# Patient Record
Sex: Female | Born: 1980 | Hispanic: No | Marital: Married | Smoking: Never smoker
Health system: Southern US, Community
[De-identification: ages and names within clinical notes are randomized; demographics above are authoritative.]

---

## 2006-01-20 ENCOUNTER — Other Ambulatory Visit: Admission: RE | Admit: 2006-01-20 | Discharge: 2006-01-20 | Payer: Self-pay | Admitting: Family Medicine

## 2007-02-15 ENCOUNTER — Other Ambulatory Visit: Admission: RE | Admit: 2007-02-15 | Discharge: 2007-02-15 | Payer: Self-pay | Admitting: Family Medicine

## 2008-04-30 ENCOUNTER — Inpatient Hospital Stay (HOSPITAL_COMMUNITY): Admission: AD | Admit: 2008-04-30 | Discharge: 2008-04-30 | Payer: Self-pay | Admitting: Obstetrics and Gynecology

## 2008-05-05 ENCOUNTER — Inpatient Hospital Stay (HOSPITAL_COMMUNITY): Admission: AD | Admit: 2008-05-05 | Discharge: 2008-05-05 | Payer: Self-pay | Admitting: Obstetrics and Gynecology

## 2008-05-13 ENCOUNTER — Inpatient Hospital Stay (HOSPITAL_COMMUNITY): Admission: AD | Admit: 2008-05-13 | Discharge: 2008-05-15 | Payer: Self-pay | Admitting: Obstetrics & Gynecology

## 2008-05-13 ENCOUNTER — Encounter (INDEPENDENT_AMBULATORY_CARE_PROVIDER_SITE_OTHER): Payer: Self-pay | Admitting: Obstetrics & Gynecology

## 2009-08-07 ENCOUNTER — Other Ambulatory Visit: Admission: RE | Admit: 2009-08-07 | Discharge: 2009-08-07 | Payer: Self-pay | Admitting: Family Medicine

## 2009-12-11 IMAGING — US US FETAL BPP W/O NONSTRESS
1 series · 6 of 6 positions shown · non-contrast
Comparison: none

OBSTETRICAL ULTRASOUND:
 This ultrasound exam was performed in the [HOSPITAL] Ultrasound Department.  The OB US report was generated in the AS system, and faxed to the ordering physician.  This report is also available in [REDACTED] PACS.

[Series 1: us fetal bpp w/o nonstress · 0.28mm/px · 6 of 6 slices shown]
[im 1/6]
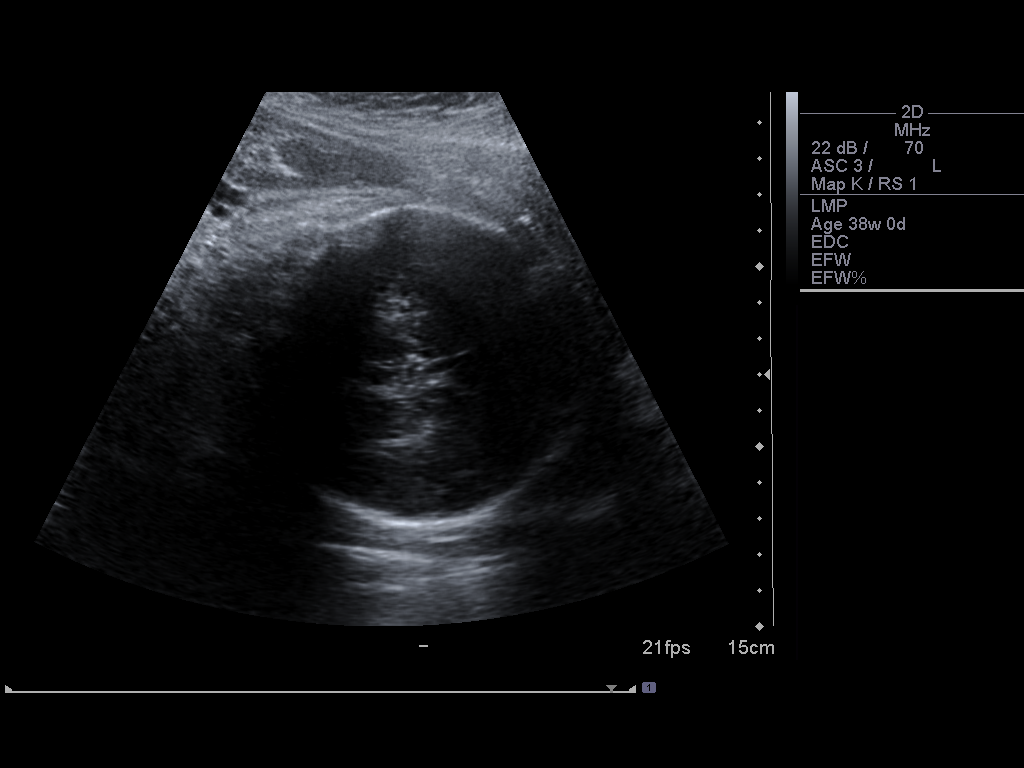
[im 2/6]
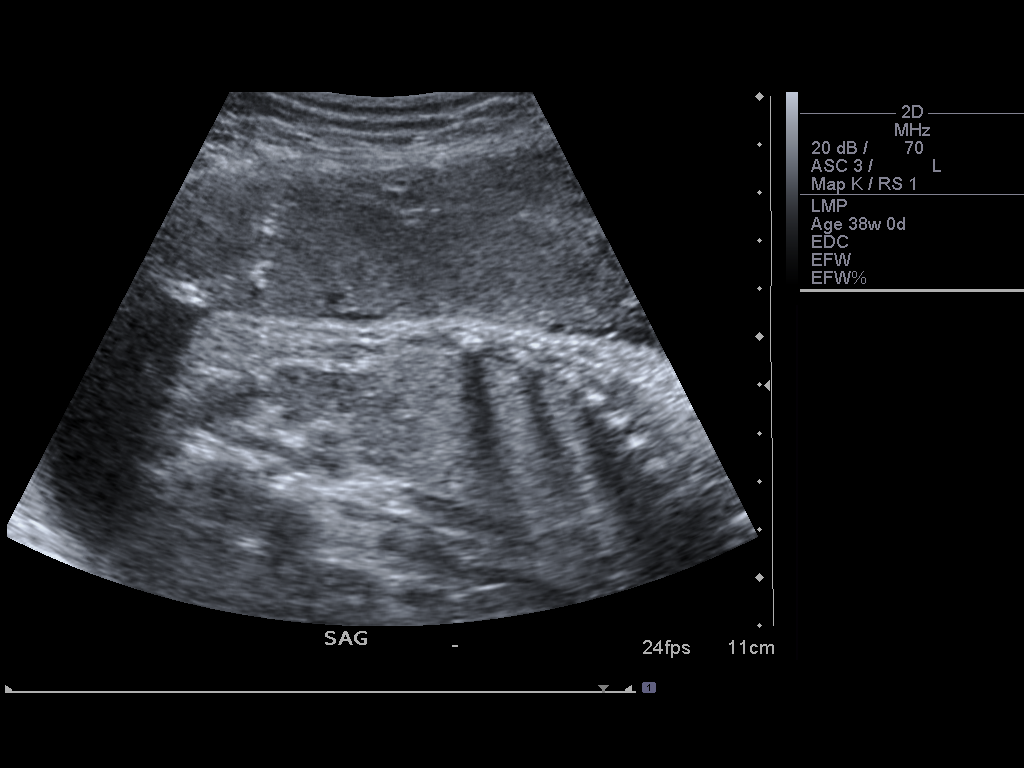
[im 3/6]
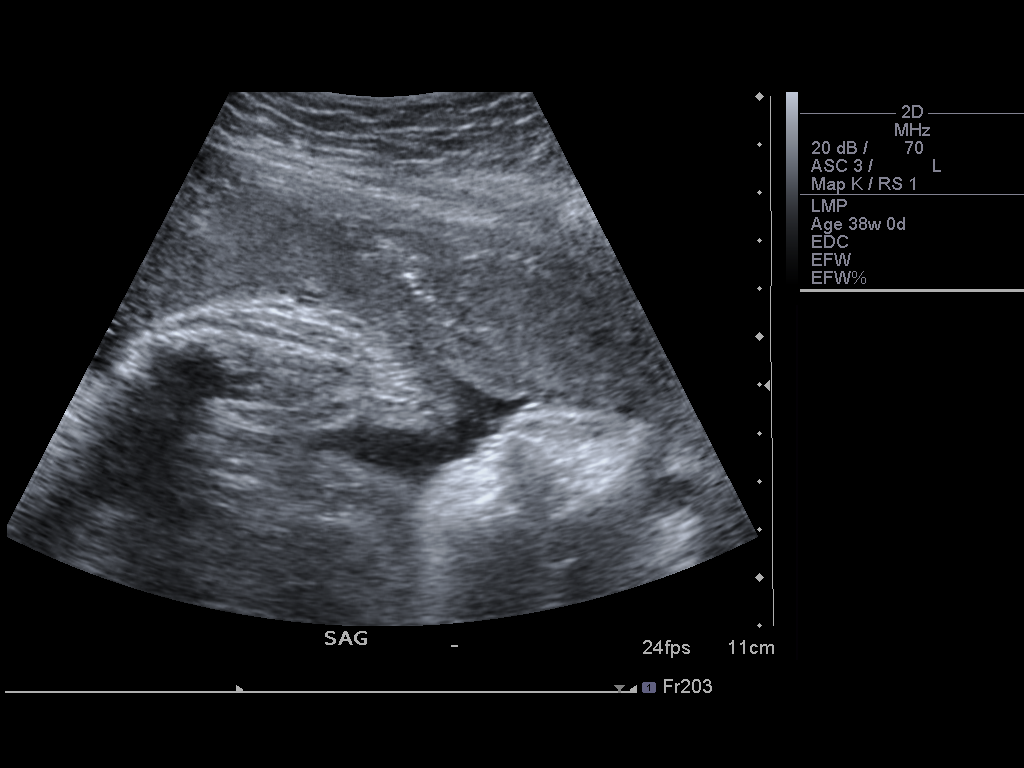
[im 4/6]
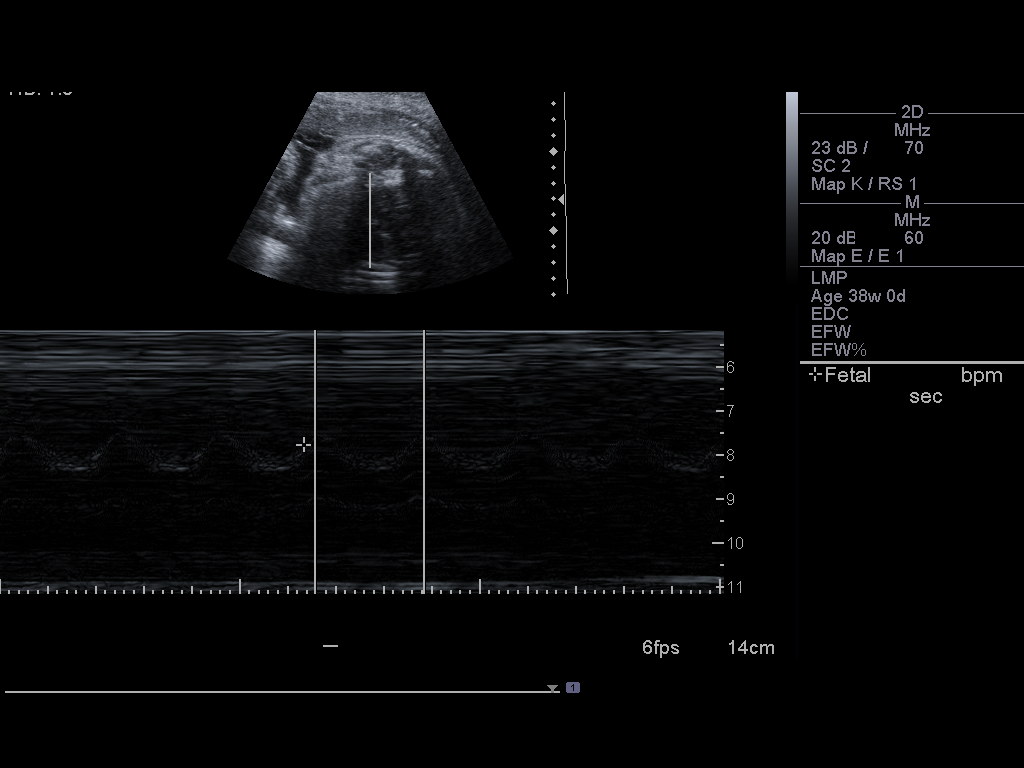
[im 5/6]
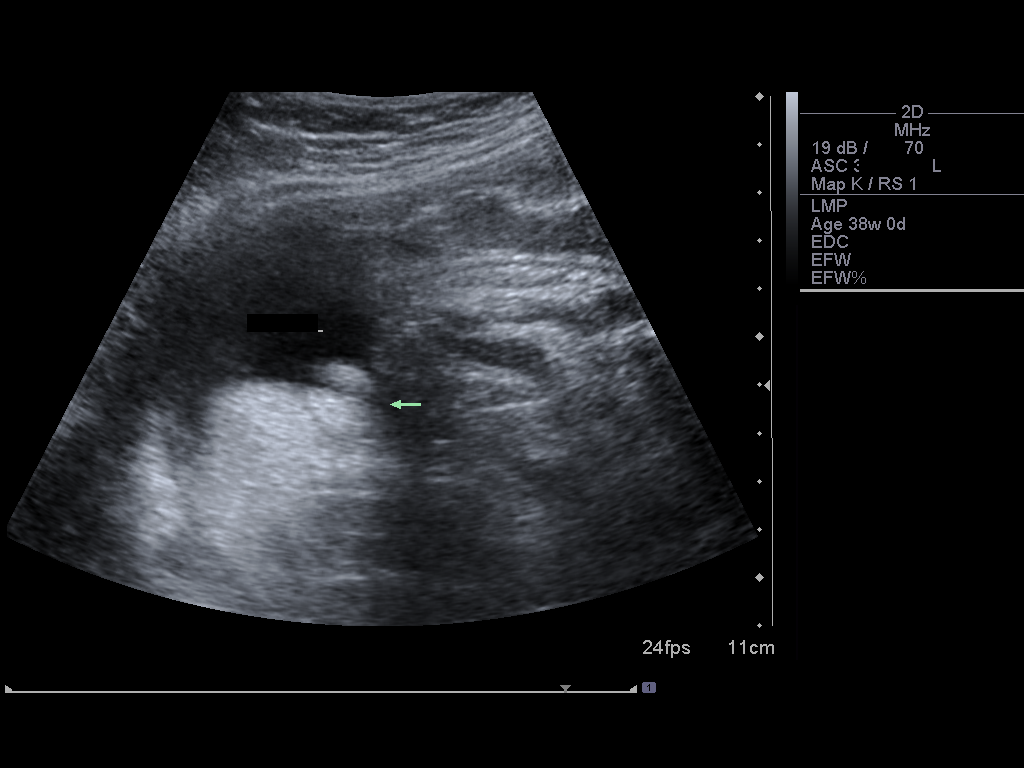
[im 6/6]
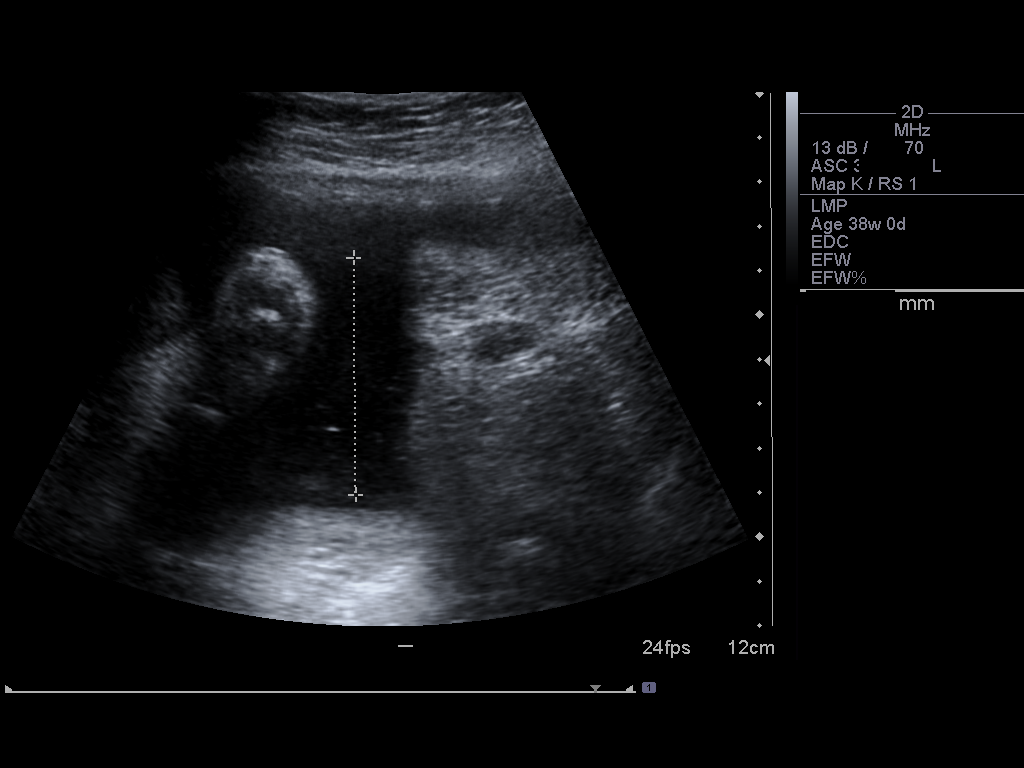

[6 of 6 positions shown; findings below may reference images not displayed]

IMPRESSION: See AS Obstetric US report.

## 2010-11-09 ENCOUNTER — Encounter: Payer: Self-pay | Admitting: Obstetrics and Gynecology

## 2010-12-26 ENCOUNTER — Inpatient Hospital Stay (HOSPITAL_COMMUNITY)
Admission: AD | Admit: 2010-12-26 | Discharge: 2010-12-26 | Disposition: A | Discharge status: Home / Self Care | Payer: BC Managed Care – HMO | Source: Ambulatory Visit | Attending: Obstetrics & Gynecology | Admitting: Obstetrics & Gynecology

## 2010-12-26 DIAGNOSIS — O4700 False labor before 37 completed weeks of gestation, unspecified trimester: Secondary | ICD-10-CM | POA: Insufficient documentation

## 2010-12-26 DIAGNOSIS — R109 Unspecified abdominal pain: Secondary | ICD-10-CM | POA: Insufficient documentation

## 2010-12-26 LAB — URINE MICROSCOPIC-ADD ON

## 2010-12-26 LAB — URINALYSIS, ROUTINE W REFLEX MICROSCOPIC
Bilirubin Urine: NEGATIVE
Ketones, ur: NEGATIVE mg/dL
Nitrite: NEGATIVE
Urobilinogen, UA: 0.2 mg/dL (ref 0.0–1.0)
pH: 6 (ref 5.0–8.0)

## 2010-12-29 ENCOUNTER — Other Ambulatory Visit: Payer: Self-pay | Admitting: Obstetrics and Gynecology

## 2011-02-22 ENCOUNTER — Inpatient Hospital Stay (HOSPITAL_COMMUNITY)
Admission: AD | Admit: 2011-02-22 | Discharge: 2011-02-24 | DRG: 373 | Disposition: A | Discharge status: Home / Self Care | Payer: BC Managed Care – HMO | Source: Ambulatory Visit | Attending: Obstetrics and Gynecology | Admitting: Obstetrics and Gynecology

## 2011-02-22 LAB — CBC
HCT: 39.7 % (ref 36.0–46.0)
Hemoglobin: 13.3 g/dL (ref 12.0–15.0)
MCV: 83.8 fL (ref 78.0–100.0)
RBC: 4.74 MIL/uL (ref 3.87–5.11)
RDW: 14.4 % (ref 11.5–15.5)
WBC: 9.1 10*3/uL (ref 4.0–10.5)

## 2011-02-23 LAB — CBC
Hemoglobin: 9.9 g/dL — ABNORMAL LOW (ref 12.0–15.0)
MCH: 28 pg (ref 26.0–34.0)
MCV: 84.1 fL (ref 78.0–100.0)
Platelets: 118 10*3/uL — ABNORMAL LOW (ref 150–400)
RBC: 3.53 MIL/uL — ABNORMAL LOW (ref 3.87–5.11)
WBC: 10.4 10*3/uL (ref 4.0–10.5)

## 2011-03-13 ENCOUNTER — Ambulatory Visit (HOSPITAL_COMMUNITY)
Admission: RE | Admit: 2011-03-13 | Discharge: 2011-03-13 | Disposition: A | Discharge status: Home / Self Care | Payer: BC Managed Care – HMO | Source: Ambulatory Visit | Attending: Obstetrics and Gynecology | Admitting: Obstetrics and Gynecology

## 2011-03-13 DIAGNOSIS — O925 Suppressed lactation: Secondary | ICD-10-CM | POA: Insufficient documentation

## 2011-07-17 LAB — CBC
HCT: 40
Hemoglobin: 11.3 — ABNORMAL LOW
Hemoglobin: 13.4
MCHC: 33.7
MCV: 88.4
MCV: 88.8
Platelets: 179
RBC: 3.78 — ABNORMAL LOW
RDW: 14.2
RDW: 14.7

## 2016-07-16 DIAGNOSIS — Z23 Encounter for immunization: Secondary | ICD-10-CM | POA: Diagnosis not present

## 2016-07-16 DIAGNOSIS — Z0000 Encounter for general adult medical examination without abnormal findings: Secondary | ICD-10-CM | POA: Diagnosis not present

## 2016-07-16 DIAGNOSIS — Z13220 Encounter for screening for lipoid disorders: Secondary | ICD-10-CM | POA: Diagnosis not present

## 2016-07-16 DIAGNOSIS — E039 Hypothyroidism, unspecified: Secondary | ICD-10-CM | POA: Diagnosis not present

## 2016-08-29 DIAGNOSIS — H6122 Impacted cerumen, left ear: Secondary | ICD-10-CM | POA: Diagnosis not present

## 2016-08-29 DIAGNOSIS — H66002 Acute suppurative otitis media without spontaneous rupture of ear drum, left ear: Secondary | ICD-10-CM | POA: Diagnosis not present

## 2017-07-13 DIAGNOSIS — Z23 Encounter for immunization: Secondary | ICD-10-CM | POA: Diagnosis not present

## 2017-07-13 DIAGNOSIS — Z0000 Encounter for general adult medical examination without abnormal findings: Secondary | ICD-10-CM | POA: Diagnosis not present

## 2017-07-13 DIAGNOSIS — Z136 Encounter for screening for cardiovascular disorders: Secondary | ICD-10-CM | POA: Diagnosis not present

## 2018-07-06 DIAGNOSIS — F329 Major depressive disorder, single episode, unspecified: Secondary | ICD-10-CM | POA: Diagnosis not present

## 2018-07-20 DIAGNOSIS — Z0000 Encounter for general adult medical examination without abnormal findings: Secondary | ICD-10-CM | POA: Diagnosis not present

## 2018-07-20 DIAGNOSIS — Z23 Encounter for immunization: Secondary | ICD-10-CM | POA: Diagnosis not present

## 2018-10-28 DIAGNOSIS — N926 Irregular menstruation, unspecified: Secondary | ICD-10-CM | POA: Diagnosis not present

## 2018-10-28 DIAGNOSIS — Z6824 Body mass index (BMI) 24.0-24.9, adult: Secondary | ICD-10-CM | POA: Diagnosis not present

## 2019-03-29 DIAGNOSIS — Z13220 Encounter for screening for lipoid disorders: Secondary | ICD-10-CM | POA: Diagnosis not present

## 2019-03-29 DIAGNOSIS — Z8349 Family history of other endocrine, nutritional and metabolic diseases: Secondary | ICD-10-CM | POA: Diagnosis not present

## 2019-03-29 DIAGNOSIS — Z0000 Encounter for general adult medical examination without abnormal findings: Secondary | ICD-10-CM | POA: Diagnosis not present

## 2019-08-07 DIAGNOSIS — Z1159 Encounter for screening for other viral diseases: Secondary | ICD-10-CM | POA: Diagnosis not present

## 2020-05-08 DIAGNOSIS — Z20822 Contact with and (suspected) exposure to covid-19: Secondary | ICD-10-CM | POA: Diagnosis not present

## 2020-06-22 DIAGNOSIS — Z20822 Contact with and (suspected) exposure to covid-19: Secondary | ICD-10-CM | POA: Diagnosis not present

## 2020-07-08 DIAGNOSIS — Z20822 Contact with and (suspected) exposure to covid-19: Secondary | ICD-10-CM | POA: Diagnosis not present

## 2020-07-08 DIAGNOSIS — Z03818 Encounter for observation for suspected exposure to other biological agents ruled out: Secondary | ICD-10-CM | POA: Diagnosis not present

## 2020-08-29 DIAGNOSIS — Z13220 Encounter for screening for lipoid disorders: Secondary | ICD-10-CM | POA: Diagnosis not present

## 2020-08-29 DIAGNOSIS — Z0000 Encounter for general adult medical examination without abnormal findings: Secondary | ICD-10-CM | POA: Diagnosis not present

## 2020-08-29 DIAGNOSIS — Z131 Encounter for screening for diabetes mellitus: Secondary | ICD-10-CM | POA: Diagnosis not present

## 2020-09-02 DIAGNOSIS — Z20822 Contact with and (suspected) exposure to covid-19: Secondary | ICD-10-CM | POA: Diagnosis not present

## 2020-11-10 DIAGNOSIS — Z20822 Contact with and (suspected) exposure to covid-19: Secondary | ICD-10-CM | POA: Diagnosis not present

## 2020-11-13 DIAGNOSIS — Z20822 Contact with and (suspected) exposure to covid-19: Secondary | ICD-10-CM | POA: Diagnosis not present

## 2020-11-27 DIAGNOSIS — Z20822 Contact with and (suspected) exposure to covid-19: Secondary | ICD-10-CM | POA: Diagnosis not present

## 2021-03-20 DIAGNOSIS — Z20822 Contact with and (suspected) exposure to covid-19: Secondary | ICD-10-CM | POA: Diagnosis not present

## 2022-02-16 DIAGNOSIS — B349 Viral infection, unspecified: Secondary | ICD-10-CM | POA: Diagnosis not present

## 2022-02-16 DIAGNOSIS — J029 Acute pharyngitis, unspecified: Secondary | ICD-10-CM | POA: Diagnosis not present

## 2022-02-16 DIAGNOSIS — R509 Fever, unspecified: Secondary | ICD-10-CM | POA: Diagnosis not present

## 2022-02-16 DIAGNOSIS — R519 Headache, unspecified: Secondary | ICD-10-CM | POA: Diagnosis not present

## 2022-02-16 DIAGNOSIS — Z03818 Encounter for observation for suspected exposure to other biological agents ruled out: Secondary | ICD-10-CM | POA: Diagnosis not present

## 2022-02-18 ENCOUNTER — Emergency Department (HOSPITAL_BASED_OUTPATIENT_CLINIC_OR_DEPARTMENT_OTHER)
Admission: EM | Admit: 2022-02-18 | Discharge: 2022-02-18 | Disposition: A | Discharge status: Home / Self Care | Payer: BLUE CROSS/BLUE SHIELD | Attending: Emergency Medicine | Admitting: Emergency Medicine

## 2022-02-18 ENCOUNTER — Other Ambulatory Visit: Payer: Self-pay

## 2022-02-18 ENCOUNTER — Emergency Department (HOSPITAL_BASED_OUTPATIENT_CLINIC_OR_DEPARTMENT_OTHER): Payer: BLUE CROSS/BLUE SHIELD

## 2022-02-18 ENCOUNTER — Encounter (HOSPITAL_BASED_OUTPATIENT_CLINIC_OR_DEPARTMENT_OTHER): Payer: Self-pay

## 2022-02-18 DIAGNOSIS — R509 Fever, unspecified: Secondary | ICD-10-CM | POA: Diagnosis not present

## 2022-02-18 DIAGNOSIS — Z20822 Contact with and (suspected) exposure to covid-19: Secondary | ICD-10-CM | POA: Diagnosis not present

## 2022-02-18 DIAGNOSIS — R6889 Other general symptoms and signs: Secondary | ICD-10-CM | POA: Diagnosis not present

## 2022-02-18 DIAGNOSIS — R519 Headache, unspecified: Secondary | ICD-10-CM | POA: Insufficient documentation

## 2022-02-18 DIAGNOSIS — R791 Abnormal coagulation profile: Secondary | ICD-10-CM | POA: Insufficient documentation

## 2022-02-18 DIAGNOSIS — R6883 Chills (without fever): Secondary | ICD-10-CM | POA: Diagnosis not present

## 2022-02-18 DIAGNOSIS — N3090 Cystitis, unspecified without hematuria: Secondary | ICD-10-CM | POA: Insufficient documentation

## 2022-02-18 LAB — CBC WITH DIFFERENTIAL/PLATELET
Abs Immature Granulocytes: 0.03 10*3/uL (ref 0.00–0.07)
Basophils Absolute: 0.1 10*3/uL (ref 0.0–0.1)
Basophils Relative: 1 %
Eosinophils Absolute: 0 10*3/uL (ref 0.0–0.5)
Eosinophils Relative: 0 %
HCT: 38.1 % (ref 36.0–46.0)
Hemoglobin: 13.2 g/dL (ref 12.0–15.0)
Immature Granulocytes: 0 %
Lymphocytes Relative: 14 %
Lymphs Abs: 1.8 10*3/uL (ref 0.7–4.0)
MCH: 27.8 pg (ref 26.0–34.0)
MCHC: 34.6 g/dL (ref 30.0–36.0)
MCV: 80.4 fL (ref 80.0–100.0)
Monocytes Absolute: 1 10*3/uL (ref 0.1–1.0)
Monocytes Relative: 8 %
Neutro Abs: 9.7 10*3/uL — ABNORMAL HIGH (ref 1.7–7.7)
Neutrophils Relative %: 77 %
Platelets: 252 10*3/uL (ref 150–400)
RBC: 4.74 MIL/uL (ref 3.87–5.11)
RDW: 12.1 % (ref 11.5–15.5)
WBC: 12.6 10*3/uL — ABNORMAL HIGH (ref 4.0–10.5)
nRBC: 0 % (ref 0.0–0.2)

## 2022-02-18 LAB — COMPREHENSIVE METABOLIC PANEL
ALT: 10 U/L (ref 0–44)
AST: 21 U/L (ref 15–41)
Albumin: 4.3 g/dL (ref 3.5–5.0)
Alkaline Phosphatase: 40 U/L (ref 38–126)
Anion gap: 9 (ref 5–15)
BUN: 12 mg/dL (ref 6–20)
CO2: 27 mmol/L (ref 22–32)
Calcium: 9.5 mg/dL (ref 8.9–10.3)
Chloride: 93 mmol/L — ABNORMAL LOW (ref 98–111)
Creatinine, Ser: 0.98 mg/dL (ref 0.44–1.00)
GFR, Estimated: >60 mL/min (ref >60)
Glucose, Bld: 108 mg/dL — ABNORMAL HIGH (ref 70–99)
Potassium: 3.9 mmol/L (ref 3.5–5.1)
Sodium: 129 mmol/L — ABNORMAL LOW (ref 135–145)
Total Bilirubin: 0.5 mg/dL (ref 0.3–1.2)
Total Protein: 7.7 g/dL (ref 6.5–8.1)

## 2022-02-18 LAB — LACTIC ACID, PLASMA
Lactic Acid, Venous: 1.4 mmol/L (ref 0.5–1.9)
Lactic Acid, Venous: 0.7 mmol/L (ref 0.5–1.9)

## 2022-02-18 LAB — URINALYSIS, ROUTINE W REFLEX MICROSCOPIC
Bilirubin Urine: NEGATIVE
Glucose, UA: NEGATIVE mg/dL
Ketones, ur: 15 mg/dL — AB
Nitrite: NEGATIVE
Specific Gravity, Urine: 1.012 (ref 1.005–1.030)
pH: 6.5 (ref 5.0–8.0)

## 2022-02-18 LAB — PROTIME-INR
INR: 1.1 (ref 0.8–1.2)
Prothrombin Time: 13.6 seconds (ref 11.4–15.2)

## 2022-02-18 LAB — RESP PANEL BY RT-PCR (FLU A&B, COVID) ARPGX2
Influenza A by PCR: NEGATIVE
Influenza B by PCR: NEGATIVE
SARS Coronavirus 2 by RT PCR: NEGATIVE

## 2022-02-18 LAB — PREGNANCY, URINE: Preg Test, Ur: NEGATIVE

## 2022-02-18 LAB — APTT: aPTT: 32 seconds (ref 24–36)

## 2022-02-18 LAB — SEDIMENTATION RATE: Sed Rate: 20 mm/hr (ref 0–22)

## 2022-02-18 MED ORDER — SODIUM CHLORIDE 0.9 % IV BOLUS
1000.0000 mL | Freq: Once | INTRAVENOUS | Status: AC
  Administered 2022-02-18: 1000 mL via INTRAVENOUS

## 2022-02-18 MED ORDER — KETOROLAC TROMETHAMINE 15 MG/ML IJ SOLN
15.0000 mg | Freq: Once | INTRAMUSCULAR | Status: AC
  Administered 2022-02-18: 15 mg via INTRAVENOUS
  Filled 2022-02-18: qty 1

## 2022-02-18 MED ORDER — ONDANSETRON HCL 4 MG/2ML IJ SOLN
4.0000 mg | Freq: Once | INTRAMUSCULAR | Status: AC
  Administered 2022-02-18: 4 mg via INTRAVENOUS
  Filled 2022-02-18: qty 2

## 2022-02-18 MED ORDER — ACETAMINOPHEN 325 MG PO TABS
650.0000 mg | ORAL_TABLET | Freq: Once | ORAL | Status: AC
  Administered 2022-02-18: 650 mg via ORAL
  Filled 2022-02-18: qty 2

## 2022-02-18 MED ORDER — CEPHALEXIN 500 MG PO CAPS: 500.0000 mg | ORAL_CAPSULE | Freq: Four times a day (QID) | ORAL | 0 refills | Status: DC

## 2022-02-18 MED ORDER — ONDANSETRON 8 MG PO TBDP: 8.0000 mg | ORAL_TABLET | Freq: Three times a day (TID) | ORAL | 0 refills | Status: AC | PRN

## 2022-02-18 MED ORDER — CEFTRIAXONE SODIUM 1 G IJ SOLR
1.0000 g | Freq: Once | INTRAMUSCULAR | Status: AC
  Administered 2022-02-18: 1 g via INTRAVENOUS
  Filled 2022-02-18: qty 10

## 2022-02-18 MED ORDER — CEPHALEXIN 500 MG PO CAPS: 500.0000 mg | ORAL_CAPSULE | Freq: Three times a day (TID) | ORAL | 0 refills | Status: AC

## 2022-02-18 MED ORDER — METOCLOPRAMIDE HCL 5 MG/ML IJ SOLN
10.0000 mg | Freq: Once | INTRAMUSCULAR | Status: AC
  Administered 2022-02-18: 10 mg via INTRAVENOUS
  Filled 2022-02-18: qty 2

## 2022-02-18 NOTE — Discharge Instructions (Addendum)
You are seen in the ER for fevers, chills. ? ?We had discussed with you admission overnight and lumbar puncture as additional interventions, that you have declined. ? ?Please take the antibiotics prescribed.  It is unclear what the driver for your fever is, one of the possibility is urine based on our test here. ? ?Please follow-up with the infectious disease doctor in 3 to 5 days.  We anticipate that their office will call you tomorrow morning for an appointment, if they do not call you by 1 PM, call the number provided. ?

## 2022-02-18 NOTE — ED Triage Notes (Signed)
Patient here POV from Home. ? ?Endorses Fever and Nausea present since Sunday. 1 Episode of Emesis this AM. Headache today. No Diarrhea ? ?Negative for COVID-19, RSV, and Flu at [facility] on Monday. Mild Improvement with Zofran from Same. ? ?NAD Noted during Triage. A&Ox4. GCS 15. Ambulatory. ?

## 2022-02-18 NOTE — ED Provider Notes (Addendum)
[facility] EMERGENCY DEPT Provider Note   CSN: [id number] Arrival date & time: 02/18/22  1757     History  Chief Complaint  Patient presents with   Fever    Gloria Everett is a 41 y.o. female.  HPI     41 year old female comes in with chief complaint of fevers.  Patient has no clinically significant medical history.  She indicates that she started getting sick on Sunday with initial symptoms of nausea, vomiting and fevers.  On Monday her symptoms were persistent, so she had gone to walk-in clinic at her PCP.  She was prescribed Zofran and they had done COVID-19, RSV and flu swabs.  Patient remained nauseous and had persistent vomiting that day and all day yesterday.  Yesterday, per husband when he returned from home, patient had a temp of 104.  She also was also incoherent with her responses.  Patient was given Tylenol and Zofran, her fever improved and she was back to baseline mental status.  They called the PCP office at that time and were advised that the COVID-19, flu and RSV test were negative, and to continue watching the symptoms.  Today patient remains nauseous.  Vomiting is slightly better.  She started having some headaches, and the fevers have persisted, prompting the family to bring her into the ER for further evaluation after talking to the PCP.  Of note, patient has had rigors with the fevers for the the last 3 days and she also has had episodes of sweats.  Headaches are described as moderate to severe, and have been persistent today.  She has no neck pain, focal weakness, numbness, slurred speech, vision change, dizziness or balance issues associated with it.  Patient's brother did have esophageal cancer at age 84, but she does not have any cancer history.  Home Medications Prior to Admission medications   Medication Sig Start Date End Date Taking? Authorizing Provider  ondansetron (ZOFRAN-ODT) 8 MG disintegrating tablet Take 1 tablet (8 mg total) by  mouth every 8 (eight) hours as needed for nausea. 02/18/22  Yes Sabena Winner, MD  cephALEXin (KEFLEX) 500 MG capsule Take 1 capsule (500 mg total) by mouth 3 (three) times daily. 02/18/22   Derwood Kaplan, MD      Allergies    Patient has no allergy information on record.    Review of Systems   Review of Systems  Physical Exam Updated Vital Signs BP 106/65   Pulse 94   Temp 99.1 F (37.3 C) (Oral)   Resp 20   Ht 5\' 5"  (1.651 m)   Wt 68 kg   LMP 01/28/2022   SpO2 100%   BMI 24.96 kg/m  Physical Exam Vitals and nursing note reviewed.  Constitutional:      General: She is in acute distress.     Appearance: She is well-developed.  HENT:     Head: Atraumatic.  Eyes:     General: No scleral icterus.    Extraocular Movements: Extraocular movements intact.     Pupils: Pupils are equal, round, and reactive to light.  Neck:     Comments: No meningismus Cardiovascular:     Rate and Rhythm: Tachycardia present.  Pulmonary:     Effort: Pulmonary effort is normal.  Musculoskeletal:     Cervical back: Normal range of motion and neck supple.  Skin:    General: Skin is warm and dry.     Findings: No rash.  Neurological:     Mental Status: She is alert  and oriented to person, place, and time.     Cranial Nerves: No cranial nerve deficit.     Sensory: No sensory deficit.     Motor: No weakness.     Coordination: Coordination normal.    ED Results / Procedures / Treatments   Labs (all labs ordered are listed, but only abnormal results are displayed) Labs Reviewed  COMPREHENSIVE METABOLIC PANEL - Abnormal; Notable for the following components:      Result Value   Sodium 129 (*)    Chloride 93 (*)    Glucose, Bld 108 (*)    All other components within normal limits  CBC WITH DIFFERENTIAL/PLATELET - Abnormal; Notable for the following components:   WBC 12.6 (*)    Neutro Abs 9.7 (*)    All other components within normal limits  URINALYSIS, ROUTINE W REFLEX MICROSCOPIC -  Abnormal; Notable for the following components:   Hgb urine dipstick TRACE (*)    Ketones, ur 15 (*)    Protein, ur TRACE (*)    Leukocytes,Ua LARGE (*)    Bacteria, UA FEW (*)    All other components within normal limits  RESP PANEL BY RT-PCR (FLU A&B, COVID) ARPGX2  CULTURE, BLOOD (ROUTINE X 2)  CULTURE, BLOOD (ROUTINE X 2)  URINE CULTURE  LACTIC ACID, PLASMA  LACTIC ACID, PLASMA  PROTIME-INR  APTT  PREGNANCY, URINE  SEDIMENTATION RATE  C-REACTIVE PROTEIN  HEPATITIS PANEL, ACUTE  PATHOLOGIST SMEAR REVIEW  PLASMODIUM SP. PCR    EKG None  Radiology CT Head Wo Contrast  Result Date: 02/18/2022 CLINICAL DATA:  Headaches and fevers for 2 days, initial encounter EXAM: CT HEAD WITHOUT CONTRAST TECHNIQUE: Contiguous axial images were obtained from the base of the skull through the vertex without intravenous contrast. RADIATION DOSE REDUCTION: This exam was performed according to the departmental dose-optimization program which includes automated exposure control, adjustment of the mA and/or kV according to patient size and/or use of iterative reconstruction technique. COMPARISON:  None Available. FINDINGS: Brain: No evidence of acute infarction, hemorrhage, hydrocephalus, extra-axial collection or mass lesion/mass effect. Vascular: No hyperdense vessel or unexpected calcification. Skull: Normal. Negative for fracture or focal lesion. Sinuses/Orbits: No acute finding. Other: None. IMPRESSION: No acute intracranial abnormality noted. Electronically Signed   By: Alcide Clever M.D.   On: 02/18/2022 20:01   DG Chest Port 1 View  Result Date: 02/18/2022 CLINICAL DATA:  Fevers EXAM: PORTABLE CHEST 1 VIEW COMPARISON:  None Available. FINDINGS: The heart size and mediastinal contours are within normal limits. Both lungs are clear. The visualized skeletal structures are unremarkable. IMPRESSION: No active disease. Electronically Signed   By: Alcide Clever M.D.   On: 02/18/2022 20:05     Procedures .Critical Care Performed by: Derwood Kaplan, MD Authorized by: Derwood Kaplan, MD   Critical care provider statement:    Critical care time (minutes):  40   Critical care was necessary to treat or prevent imminent or life-threatening deterioration of the following conditions:  Sepsis and circulatory failure   Critical care was time spent personally by me on the following activities:  Development of treatment plan with patient or surrogate, discussions with consultants, evaluation of patient's response to treatment, examination of patient, ordering and review of laboratory studies, ordering and review of radiographic studies, ordering and performing treatments and interventions, pulse oximetry, re-evaluation of patient's condition and review of old charts    Medications Ordered in ED Medications  acetaminophen (TYLENOL) tablet 650 mg (650 mg Oral Given 02/18/22 1931)  ondansetron [facility]) injection 4 mg (4 mg Intravenous Given 02/18/22 1930)  sodium chloride 0.9 % bolus 1,000 mL (0 mLs Intravenous Stopped 02/18/22 2159)  ketorolac (TORADOL) 15 MG/ML injection 15 mg (15 mg Intravenous Given 02/18/22 2100)  metoCLOPramide (REGLAN) injection 10 mg (10 mg Intravenous Given 02/18/22 2101)  cefTRIAXone (ROCEPHIN) 1 g in sodium chloride 0.9 % 100 mL IVPB (0 g Intravenous Stopped 02/18/22 2231)  sodium chloride 0.9 % bolus 1,000 mL (0 mLs Intravenous Stopped 02/18/22 2329)    ED Course/ Medical Decision Making/ A&P Clinical Course as of 02/18/22 2342  Wed Feb 18, 2022  2200 Discussed patient's case with Dr. Thornton Dales there, infectious disease doctor.  She requested to be sent hepatitis panel along with malaria smear.  She does not think LP is indicated unless patient is deteriorating. Discussed LP indication with the patient, given that she is having headache and had some confusion/delirium yesterday.  Patient does not want CT scan of the brain.  Also discussed with the patient observation stay  overnight versus going home.  Patient prefers going home.  Husband at the bedside, reliable.  He will likely take a day off tomorrow.  They will return to the ER if patient gets worse.  ID doctor did recommend that we start patient on antibiotics if there is any concerns for UTI.  Although clinically, patient is not having UTI-like symptoms, UA is leukocyte positive.  We will start her on Keflex.  ID doctor to follow-up with the patient and 2 to 3 days. [AN]  2327 Patient reassessed when the nurse advised me about her low blood pressure.  Patient states that she fell asleep, and that her pressure is always low.  With second bag of IV fluid hanging, her blood pressure has responded to 99/64.  She is still wants to go home. Stable for discharge after IV fluids. [AN]    Clinical Course User Index [AN] Derwood Kaplan, MD                           Medical Decision Making Amount and/or Complexity of Data Reviewed Labs: ordered. Radiology: ordered. ECG/medicine tests: ordered.  Risk OTC drugs. Prescription drug management.   This patient presents to the ED with chief complaint(s) of fevers, headache, rigors, nausea with vomiting pertinent past medical history of medication about 4 weeks ago in [place], where there were no mosquito bites that the patient is aware of which further complicates the presenting complaint. The complaint involves an extensive differential diagnosis and treatment options and also carries with it a high risk of complications and morbidity.    Patient noted to be febrile, has rigors and it appears that yesterday she had delirium type episode.  She is also having headaches today.  She has no meningismus.  She is AOx3 right now with no focal neurodeficits.  Patient is immunocompetent.  History is not indicative of any source.  Patient has no rash, arthralgias, myalgias.  Social history is reassuring.  The differential diagnosis includes : DDx includes: Sepsis syndrome  -unknown source Encephalopathy -due to high fevers Encephalitis, meningitis, cystitis considered. Given the travel history, malaria, typhoid, dengue fever, Zika virus also considered in the differential.   The initial plan is to focus on symptom management and initiate sepsis work-up  Additional history obtained: Additional history obtained from spouse   Independent visualization of imaging: - Imaging that was independently visualized with scope of interpretation limited to determining acute life threatening  conditions related to emergency care: CT scan of the brain and x-ray of the chest, which revealed no evidence of brain bleed or focal pneumonia  Treatment and Reassessment: Reassessed on multiple different occasions.  Patient is feeling better on the first reassessment.  She was reassessed there after with the ID doctors recommendation. Reassessed again when her blood pressure was low and prior to discharge.  Both the patient and her husband were updated on the plans and the findings.  Consultation: - Consulted or discussed management/test interpretation w/ external professional: Dr. Esmond Harps, infectious disease.  She has recommended adding some test and she will follow-up with the patient in the clinic promptly if patient is discharged. She is comfortable with patient going home as long as she is not clinically getting worse.   Final Clinical Impression(s) / ED Diagnoses Final diagnoses:  Fever, unspecified fever cause  Cystitis  Rigors    Rx / DC Orders ED Discharge Orders          Ordered    ondansetron (ZOFRAN-ODT) 8 MG disintegrating tablet  Every 8 hours PRN        02/18/22 2332    cephALEXin (KEFLEX) 500 MG capsule  4 times daily,   Status:  Discontinued        02/18/22 2332    cephALEXin (KEFLEX) 500 MG capsule  3 times daily        02/18/22 2332              Derwood Kaplan, MD 02/18/22 2956    Derwood Kaplan, MD 02/18/22 2130    Derwood Kaplan,  MD 02/18/22 [phone]

## 2022-02-19 ENCOUNTER — Ambulatory Visit (INDEPENDENT_AMBULATORY_CARE_PROVIDER_SITE_OTHER): Payer: BLUE CROSS/BLUE SHIELD | Admitting: Infectious Disease

## 2022-02-19 ENCOUNTER — Encounter: Payer: Self-pay | Admitting: Infectious Disease

## 2022-02-19 ENCOUNTER — Other Ambulatory Visit: Payer: Self-pay

## 2022-02-19 VITALS — BP 122/74 | HR 116 | Temp 100.0°F | Resp 16 | Ht 65.0 in | Wt 153.0 lb

## 2022-02-19 DIAGNOSIS — Z113 Encounter for screening for infections with a predominantly sexual mode of transmission: Secondary | ICD-10-CM | POA: Diagnosis not present

## 2022-02-19 DIAGNOSIS — R110 Nausea: Secondary | ICD-10-CM | POA: Diagnosis not present

## 2022-02-19 DIAGNOSIS — D72829 Elevated white blood cell count, unspecified: Secondary | ICD-10-CM | POA: Diagnosis not present

## 2022-02-19 DIAGNOSIS — R4701 Aphasia: Secondary | ICD-10-CM

## 2022-02-19 DIAGNOSIS — R509 Fever, unspecified: Secondary | ICD-10-CM | POA: Diagnosis not present

## 2022-02-19 DIAGNOSIS — R778 Other specified abnormalities of plasma proteins: Secondary | ICD-10-CM

## 2022-02-19 HISTORY — DX: Elevated white blood cell count, unspecified: D72.829

## 2022-02-19 HISTORY — DX: Aphasia: R47.01

## 2022-02-19 HISTORY — DX: Fever, unspecified: R50.9

## 2022-02-19 HISTORY — DX: Nausea: R11.0

## 2022-02-19 LAB — HEPATITIS PANEL, ACUTE
HCV Ab: NONREACTIVE
Hep A IgM: NONREACTIVE
Hep B C IgM: NONREACTIVE
Hepatitis B Surface Ag: NONREACTIVE

## 2022-02-19 LAB — C-REACTIVE PROTEIN: CRP: 0.9 mg/dL (ref <1.0)

## 2022-02-19 LAB — PATHOLOGIST SMEAR REVIEW

## 2022-02-19 NOTE — Progress Notes (Signed)
? ?Subjective:  ?Reason for infectious disease consult: Fever of unknown origin and returning traveler. ? ?Requesting physician: Bjorn Pippin, MD ? ? Patient ID: Gloria Everett, female    DOB: 09-May-1981, 41 y.o.   MRN: [id number] ? ?HPI ? 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  I did notice that she had had thrombocytopenia many years ago I believe when she delivered birth. ? ?Beyond that however there is not any known medical problems that she has had she and her husband who accompanies her to clinic today said that she tested negative for tuberculosis by skin testing [place] ? ?She was doing well till recently when she came back from a trip in the [place] with her husband. ? ?The day of return or perhaps the second day of return she developed a sore throat which she thought was due to a viral illness.  This sore throat persisted but then resolved. ? ? ?Then this past weekend she began having profound fatigue malaise nausea without vomiting but with the sensation that she would vomit if she ate much. ? ?She did not have any other specific symptoms. ? ?She then was seen by [facility] care who did lab work including testing for respiratory viruses which was negative. ? ?On this past Monday she had a fever that was as high as 104 degrees and when she was talking to her husband was speaking in language that was not comprehensible to him or any other family members. ? ?Ultimately as fevers persisted she was instructed by her primary care physician for her to go to the emergency department indeed [facility]. ? ?Admits in a drug bridge she had labs done including a respiratory panel which was negative.  Her CMP was normal CBC with differential showed leukocytosis. ? ?She had some pyuria on urine analysis but 0 symptoms consistent with a urinary tract infection she did have  blood cultures drawn which so far not growing an organism. ? ?She has been placed on cephalexin again to treat a "UTI" though I do not see evidence for this clinically.  ? ?Dust x-ray was done which showed no evidence of pneumonia.  A CT of the head was performed as well lumbar puncture was contemplated given the fact that she had had fevers and some headaches and there was thought that she had confusion although the patient tells me that she never was confused merely that when she spoke people could not understand her although she herself was thinking clearly at the time. ? ?This was an isolated event which occurred when she had a temperature of 104 degrees. ? ?Hepatitis panel as well as malaria smears were taken though they are not back yet. ? ?She was offered admission to the hospital but wanted to follow-up as an outpatient and after consulting with my partner it was felt reasonable to let her follow-up as an outpatient with [place]. ? ?She was then placed on my schedule this morning as a follow-up although she is a new patient to me. ? ?Currently she still has a fever and in the clinic was measured at 100 degrees. ? ?Beyond the fever and the nausea and the isolated episode of uttering incomprehensible speech there is no specific focal symptoms at this point in time pointing to a diagnosis. ? ?I am concerned however given her recent sore  throat that then proceeded her feeling poorly that she could have had a viral infection that now has followed up with a bacterial infection and specifically potentially with a bloodstream infection. ? ?I offered her potential elective admission to the hospital today so she could be observed and further work-up could be pursued including CT of the abdomen pelvis and monitoring of her blood cultures. ? ?She still wanted to stay as an outpatient even though my having her directly admitted would have circumvented need to go to the emergency department. ? ?I did say we should check for  labs for HIV syphilis and for TB even though I do not think any of these are likely at this point in time. ? ? ?Past Medical History:  ?Diagnosis Date  ? FUO (fever of unknown origin) 02/19/2022  ? Leukocytosis 02/19/2022  ? ? ?History reviewed. No pertinent surgical history. ? ?History reviewed. No pertinent family history. ? ?  ?Social History  ? ?Socioeconomic History  ? Marital status: Married  ?  Spouse name: Not on file  ? Number of children: Not on file  ? Years of education: Not on file  ? Highest education level: Not on file  ?Occupational History  ? Not on file  ?Tobacco Use  ? Smoking status: Never  ? Smokeless tobacco: Never  ?Substance and Sexual Activity  ? Alcohol use: Never  ? Drug use: Never  ? Sexual activity: Never  ?Other Topics Concern  ? Not on file  ?Social History Narrative  ? Not on file  ? ?Social Determinants of Health  ? ?Financial Resource Strain: Not on file  ?Food Insecurity: Not on file  ?Transportation Needs: Not on file  ?Physical Activity: Not on file  ?Stress: Not on file  ?Social Connections: Not on file  ? ? ?No Known Allergies ? ? ?Current Outpatient Medications:  ?  cephALEXin (KEFLEX) 500 MG capsule, Take 1 capsule (500 mg total) by mouth 3 (three) times daily., Disp: 21 capsule, Rfl: 0 ?  ondansetron (ZOFRAN-ODT) 8 MG disintegrating tablet, Take 1 tablet (8 mg total) by mouth every 8 (eight) hours as needed for nausea., Disp: 20 tablet, Rfl: 0 ? ? ?Review of Systems  ?Constitutional:  Positive for fatigue and fever. Negative for activity change, appetite change, chills, diaphoresis and unexpected weight change.  ?HENT:  Negative for congestion, rhinorrhea, sinus pressure, sneezing, sore throat and trouble swallowing.   ?Eyes:  Negative for photophobia and visual disturbance.  ?Respiratory:  Negative for cough, chest tightness, shortness of breath, wheezing and stridor.   ?Cardiovascular:  Negative for chest pain, palpitations and leg swelling.  ?Gastrointestinal:  Positive for  nausea. Negative for abdominal distention, abdominal pain, anal bleeding, blood in stool, constipation, diarrhea and vomiting.  ?Genitourinary:  Negative for difficulty urinating, dysuria, flank pain and hematuria.  ?Musculoskeletal:  Negative for arthralgias, back pain, gait problem, joint swelling and myalgias.  ?Skin:  Negative for color change, pallor, rash and wound.  ?Neurological:  Negative for dizziness, tremors, weakness and light-headedness.  ?Hematological:  Negative for adenopathy. Does not bruise/bleed easily.  ?Psychiatric/Behavioral:  Negative for agitation, behavioral problems, confusion, decreased concentration, dysphoric mood and sleep disturbance.   ? ?   ?Objective:  ? Physical Exam ?Constitutional:   ?   General: She is not in acute distress. ?   Appearance: Normal appearance. She is well-developed. She is not ill-appearing or diaphoretic.  ?HENT:  ?   Head: Atraumatic. Microcephalic.  ?   Right Ear: Hearing and external  ear normal.  ?   Left Ear: Hearing and external ear normal.  ?   Nose: No nasal deformity or rhinorrhea.  ?   Mouth/Throat:  ?   Lips: Pink.  ?   Mouth: Mucous membranes are moist. No oral lesions.  ?   Dentition: Normal dentition. No gingival swelling.  ?   Tongue: No lesions. Tongue does not deviate from midline.  ?   Palate: No mass and lesions.  ?   Pharynx: Uvula midline. Posterior oropharyngeal erythema present. No oropharyngeal exudate or uvula swelling.  ?Eyes:  ?   General: No scleral icterus. ?   Extraocular Movements: Extraocular movements intact.  ?   Conjunctiva/sclera: Conjunctivae normal.  ?   Right eye: Right conjunctiva is not injected.  ?   Left eye: Left conjunctiva is not injected.  ?   Pupils: Pupils are equal, round, and reactive to light.  ?Neck:  ?   Vascular: No JVD.  ?Cardiovascular:  ?   Rate and Rhythm: Normal rate and regular rhythm.  ?   Heart sounds: Normal heart sounds, S1 normal and S2 normal. No murmur heard. ?  No friction rub. No gallop.   ?Pulmonary:  ?   Effort: Pulmonary effort is normal. No respiratory distress.  ?   Breath sounds: No stridor. No wheezing, rhonchi or rales.  ?Chest:  ?   Chest wall: No tenderness.  ?Abdominal:  ?   Genera

## 2022-02-20 LAB — URINE CULTURE

## 2022-02-22 LAB — PLASMODIUM SP. PCR: Plasmodium Sp. PCR: NEGATIVE

## 2022-02-23 LAB — CULTURE, BLOOD (ROUTINE X 2)
Culture: NO GROWTH
Culture: NO GROWTH
Special Requests: ADEQUATE
Special Requests: ADEQUATE

## 2022-02-24 ENCOUNTER — Encounter: Payer: Self-pay | Admitting: Infectious Disease

## 2022-02-24 ENCOUNTER — Ambulatory Visit (INDEPENDENT_AMBULATORY_CARE_PROVIDER_SITE_OTHER): Payer: BLUE CROSS/BLUE SHIELD | Admitting: Infectious Disease

## 2022-02-24 ENCOUNTER — Other Ambulatory Visit: Payer: Self-pay

## 2022-02-24 VITALS — BP 112/77 | HR 93 | Temp 98.2°F | Ht 65.0 in | Wt 146.0 lb

## 2022-02-24 DIAGNOSIS — R4701 Aphasia: Secondary | ICD-10-CM | POA: Diagnosis not present

## 2022-02-24 DIAGNOSIS — D72829 Elevated white blood cell count, unspecified: Secondary | ICD-10-CM

## 2022-02-24 DIAGNOSIS — R509 Fever, unspecified: Secondary | ICD-10-CM | POA: Diagnosis not present

## 2022-02-24 LAB — QUANTIFERON-TB GOLD PLUS
Mitogen-NIL: 2.34 IU/mL
NIL: 0.01 IU/mL
QuantiFERON-TB Gold Plus: NEGATIVE
TB1-NIL: 0 IU/mL
TB2-NIL: 0 IU/mL

## 2022-02-24 LAB — HIV-1 RNA QUANT-NO REFLEX-BLD
HIV 1 RNA Quant: NOT DETECTED copies/mL
HIV-1 RNA Quant, Log: NOT DETECTED Log copies/mL

## 2022-02-24 LAB — RPR: RPR Ser Ql: NONREACTIVE

## 2022-02-24 NOTE — Progress Notes (Signed)
? ?Subjective:  ?Chief complaint follow-up for fever unknown origin ? ? Patient ID: Gloria Everett, female    DOB: 11-Sep-1981, 41 y.o.   MRN: [id number] ? ?HPI ? ?Monet is an [place] born woman works as a Sport and exercise psychologist and has not had much in the way of prior medical problems.  I did notice that she had had thrombocytopenia many years ago I believe when she delivered birth. ? ?Beyond that however there is not any known medical problems that she has had she and her husband who accompanies her to clinic today said that she [place] negative for tuberculosis by skin [place]. ? ?She was doing well till recently when she came back from a trip in the [place] with her husband. ? ?The day of return or perhaps the second day of return she developed a sore throat which she thought was due to a viral illness.  This sore throat persisted but then resolved. ? ? ?Over the weekend before she saw me she began having profound fatigue malaise nausea without vomiting but with the sensation that she would vomit if she ate much. ? ?She did not have any other specific symptoms. ? ?She then was seen by [facility]  care who did lab work including testing for respiratory viruses which was negative. ? ?The ensuing  Tuesday she had a fever that was as high as 104 degrees and when she was talking to her husband was speaking in language that was not comprehensible to him or any other family members. ? ?Ultimately as fevers persisted she was instructed by her primary care physician for her to go to the emergency department indeed [facility]. ? ?Admits in a drug bridge she had labs done including a respiratory panel which was negative.  Her CMP was normal CBC with differential showed leukocytosis. ? ?She had some pyuria on urine analysis but 0 symptoms consistent with a urinary tract infection she did have blood cultures drawn which grew mixed flora . ? ?She has been placed  on cephalexin again to treat a "UTI" though I do not see evidence for this clinically.  ? ?Chest x-ray was done which showed no evidence of pneumonia.  A CT of the head was performed as well lumbar puncture was contemplated given the fact that she had had fevers and some headaches and there was thought that she had confusion although the patient tells me that she never was confused merely that when she spoke people could not understand her although she herself was thinking clearly at the time. ? ?This was an isolated event which occurred when she had a temperature of 104 degrees. ? ?Hepatitis panel as well as malaria smears were taken and are now back and negative ? ?She was offered admission to the hospital but wanted to follow-up as an outpatient and after consulting with my partner it was felt reasonable to let her follow-up as an outpatient with [place]. ? ?She was then placed on my schedule last week for  follow-up although she is a new patient to me. ? ?At that visit she still had a temperature of 100 degrees and still appeared unwell. ? ?Beyond the fever and the nausea and the isolated episode of uttering incomprehensible speech there is no specific focal symptoms at this point in time pointing to a diagnosis. ? ?I was concerned that she might have developed a bacterial superinfection after an initial viral infection that may  have caused her sore throat.  I offered her elective admission to the hospital again but she did not want to do so.  Fortunately she did not have a bloodstream infection and her blood cultures of something come back negative.  I did check an RPR which was negative and a QuantiFERON gold which was negative and HIV RNA also sent but this is not back yet. ? ?In the interim since I saw her last week she is improved dramatically and no longer having fevers and no longer kneeling Tylenol to treat them. ? ?She no longer suffering from nausea though she does not like taking the cephalexin that she  has been taking. ? ? ? ?Past Medical History:  ?Diagnosis Date  ? Aphasia 02/19/2022  ? FUO (fever of unknown origin) 02/19/2022  ? Leukocytosis 02/19/2022  ? Nausea 02/19/2022  ? ? ?No past surgical history on file. ? ?No family history on file. ? ?  ?Social History  ? ?Socioeconomic History  ? Marital status: Married  ?  Spouse name: Not on file  ? Number of children: Not on file  ? Years of education: Not on file  ? Highest education level: Not on file  ?Occupational History  ? Not on file  ?Tobacco Use  ? Smoking status: Never  ? Smokeless tobacco: Never  ?Substance and Sexual Activity  ? Alcohol use: Never  ? Drug use: Never  ? Sexual activity: Never  ?Other Topics Concern  ? Not on file  ?Social History Narrative  ? Not on file  ? ?Social Determinants of Health  ? ?Financial Resource Strain: Not on file  ?Food Insecurity: Not on file  ?Transportation Needs: Not on file  ?Physical Activity: Not on file  ?Stress: Not on file  ?Social Connections: Not on file  ? ? ?No Known Allergies ? ? ?Current Outpatient Medications:  ?  cephALEXin (KEFLEX) 500 MG capsule, Take 1 capsule (500 mg total) by mouth 3 (three) times daily., Disp: 21 capsule, Rfl: 0 ?  ondansetron (ZOFRAN-ODT) 8 MG disintegrating tablet, Take 1 tablet (8 mg total) by mouth every 8 (eight) hours as needed for nausea. (Patient not taking: Reported on 02/24/2022), Disp: 20 tablet, Rfl: 0 ? ? ?Review of Systems  ?Constitutional:  Positive for fatigue. Negative for activity change, appetite change, chills, diaphoresis, fever and unexpected weight change.  ?HENT:  Negative for congestion, rhinorrhea, sinus pressure, sneezing, sore throat and trouble swallowing.   ?Eyes:  Negative for photophobia and visual disturbance.  ?Respiratory:  Negative for cough, chest tightness, shortness of breath, wheezing and stridor.   ?Cardiovascular:  Negative for chest pain, palpitations and leg swelling.  ?Gastrointestinal:  Negative for abdominal distention, abdominal pain, anal  bleeding, blood in stool, constipation, diarrhea, nausea and vomiting.  ?Genitourinary:  Negative for difficulty urinating, dysuria, flank pain and hematuria.  ?Musculoskeletal:  Negative for arthralgias, back pain, gait problem, joint swelling and myalgias.  ?Skin:  Negative for color change, pallor, rash and wound.  ?Neurological:  Negative for dizziness, tremors, weakness and light-headedness.  ?Hematological:  Negative for adenopathy. Does not bruise/bleed easily.  ?Psychiatric/Behavioral:  Negative for agitation, behavioral problems, confusion, decreased concentration, dysphoric mood and sleep disturbance.   ? ?   ?Objective:  ? Physical Exam ?Constitutional:   ?   General: She is not in acute distress. ?   Appearance: Normal appearance. She is well-developed. She is not ill-appearing or diaphoretic.  ?HENT:  ?   Head: Normocephalic and atraumatic.  ?  Right Ear: Hearing and external ear normal.  ?   Left Ear: Hearing and external ear normal.  ?   Nose: No nasal deformity or rhinorrhea.  ?Eyes:  ?   General: No scleral icterus. ?   Conjunctiva/sclera: Conjunctivae normal.  ?   Right eye: Right conjunctiva is not injected.  ?   Left eye: Left conjunctiva is not injected.  ?   Pupils: Pupils are equal, round, and reactive to light.  ?Neck:  ?   Vascular: No JVD.  ?Cardiovascular:  ?   Rate and Rhythm: Normal rate and regular rhythm.  ?   Heart sounds: S1 normal and S2 normal.  ?Abdominal:  ?   General: There is no distension.  ?   Palpations: Abdomen is soft.  ?Musculoskeletal:     ?   General: Normal range of motion.  ?   Right shoulder: Normal.  ?   Left shoulder: Normal.  ?   Cervical back: Normal range of motion and neck supple.  ?   Right hip: Normal.  ?   Left hip: Normal.  ?   Right knee: Normal.  ?   Left knee: Normal.  ?Lymphadenopathy:  ?   Head:  ?   Right side of head: No submandibular, preauricular or posterior auricular adenopathy.  ?   Left side of head: No submandibular, preauricular or posterior  auricular adenopathy.  ?   Cervical: No cervical adenopathy.  ?   Right cervical: No superficial or deep cervical adenopathy. ?   Left cervical: No superficial or deep cervical adenopathy.  ?Skin: ?   Ge

## 2022-02-26 ENCOUNTER — Inpatient Hospital Stay: Payer: BLUE CROSS/BLUE SHIELD | Admitting: Internal Medicine

## 2023-06-04 DIAGNOSIS — Z0000 Encounter for general adult medical examination without abnormal findings: Secondary | ICD-10-CM | POA: Diagnosis not present

## 2023-06-04 DIAGNOSIS — Z6825 Body mass index (BMI) 25.0-25.9, adult: Secondary | ICD-10-CM | POA: Diagnosis not present

## 2023-06-04 DIAGNOSIS — E038 Other specified hypothyroidism: Secondary | ICD-10-CM | POA: Diagnosis not present

## 2023-06-04 DIAGNOSIS — Z124 Encounter for screening for malignant neoplasm of cervix: Secondary | ICD-10-CM | POA: Diagnosis not present

## 2023-06-04 DIAGNOSIS — Z1331 Encounter for screening for depression: Secondary | ICD-10-CM | POA: Diagnosis not present
